# Patient Record
Sex: Male | Born: 1995 | Race: White | Hispanic: No | Marital: Single | Smoking: Never smoker
Health system: Southern US, Community
[De-identification: ages and names within clinical notes are randomized; demographics above are authoritative.]

## PROBLEM LIST (undated history)

## (undated) DIAGNOSIS — L709 Acne, unspecified: Secondary | ICD-10-CM

## (undated) HISTORY — PX: WISDOM TOOTH EXTRACTION: SHX21

---

## 2016-07-04 ENCOUNTER — Encounter: Payer: Self-pay | Admitting: Emergency Medicine

## 2016-07-04 ENCOUNTER — Emergency Department: Payer: Managed Care, Other (non HMO)

## 2016-07-04 ENCOUNTER — Emergency Department
Admission: EM | Admit: 2016-07-04 | Discharge: 2016-07-05 | Disposition: A | Discharge status: Home / Self Care | Payer: Managed Care, Other (non HMO) | Attending: Emergency Medicine | Admitting: Emergency Medicine

## 2016-07-04 DIAGNOSIS — W1809XA Striking against other object with subsequent fall, initial encounter: Secondary | ICD-10-CM | POA: Insufficient documentation

## 2016-07-04 DIAGNOSIS — Y998 Other external cause status: Secondary | ICD-10-CM | POA: Insufficient documentation

## 2016-07-04 DIAGNOSIS — S0990XA Unspecified injury of head, initial encounter: Secondary | ICD-10-CM | POA: Diagnosis present

## 2016-07-04 DIAGNOSIS — Y92219 Unspecified school as the place of occurrence of the external cause: Secondary | ICD-10-CM | POA: Diagnosis not present

## 2016-07-04 DIAGNOSIS — F0781 Postconcussional syndrome: Secondary | ICD-10-CM | POA: Insufficient documentation

## 2016-07-04 DIAGNOSIS — Y9339 Activity, other involving climbing, rappelling and jumping off: Secondary | ICD-10-CM | POA: Diagnosis not present

## 2016-07-04 DIAGNOSIS — S0181XA Laceration without foreign body of other part of head, initial encounter: Secondary | ICD-10-CM | POA: Diagnosis not present

## 2016-07-04 MED ORDER — ACETAMINOPHEN 500 MG PO TABS
ORAL_TABLET | ORAL | Status: AC
  Filled 2016-07-04: qty 1

## 2016-07-04 MED ORDER — ACETAMINOPHEN 500 MG PO TABS
1000.0000 mg | ORAL_TABLET | Freq: Once | ORAL | Status: AC
  Administered 2016-07-04: 1000 mg via ORAL
  Filled 2016-07-04: qty 2

## 2016-07-04 NOTE — ED Provider Notes (Signed)
[facility] Emergency Department Provider Note ____________________________________________  Time seen: 2232  I have reviewed the triage vital signs and the nursing notes.  HISTORY  Chief Complaint  Facial Laceration and Head Injury  HPI Spencer Cobb is a 20 y.o. male presents to the ED for evaluation of injuries sustained after a fall during theater practice. He recalls the trap door was open for him to jump through. He got his foot caught on the cable to the door. The door slams down hitting him on the back of the head, and caused his face to slam into the floor. He sustained a nasal contusion, nosebleed, and a laceration to the left nasolabial sulcus. He also notes dizziness, nausea without vomiting, and a dull headache.   History reviewed. No pertinent past medical history.  There are no active problems to display for this patient.  Past Surgical History:  Procedure Laterality Date  . WISDOM TOOTH EXTRACTION      Prior to Admission medications   Not on File   Allergies Review of patient's allergies indicates no known allergies.  No family history on file.  Social History Social History  Substance Use Topics  . Smoking status: Never Smoker  . Smokeless tobacco: Never Used  . Alcohol use Yes   Review of Systems  Constitutional: Negative for fever. Eyes: Negative for visual changes. ENT: Negative for sore throat. Reports nosebleed and facial lacertion.  Cardiovascular: Negative for chest pain. Respiratory: Negative for shortness of breath. Gastrointestinal: Negative for abdominal pain, vomiting and diarrhea. Musculoskeletal: Negative for back pain. Skin: Negative for rash. Neurological: Negative for focal weakness or numbness. Reports headache as above ____________________________________________  PHYSICAL EXAM:  VITAL SIGNS: ED Triage Vitals  Enc Vitals Group     BP 07/04/16 2030 116/72     Pulse Rate 07/04/16 2030 68     Resp  07/04/16 2030 18     Temp 07/04/16 2030 97.8 F (36.6 C)     Temp Source 07/04/16 2030 Oral     SpO2 07/04/16 2030 99 %     Weight 07/04/16 2031 130 lb (59 kg)     Height 07/04/16 2031 5\' 6"  (1.676 m)     Head Circumference --      Peak Flow --      Pain Score 07/04/16 2031 7     Pain Loc --      Pain Edu? --      Excl. in GC? --     Constitutional: Alert and oriented. Well appearing and in no distress. Head: Normocephalic and atraumatic, except for a superficial linear laceration that extends from the left nasal ala across the nasolabial sulcus about 3 cm. No active bleeding.  Eyes: Conjunctivae are normal. PERRL. Normal extraocular movements Ears: Canals clear. TMs intact bilaterally. Nose: No congestion/rhinorrhea/epistaxis. Dried blood in the bilateral vestibules. No active bleeding. No septal hematoma noted.  Mouth/Throat: Mucous membranes are moist. Neck: Supple. No thyromegaly. Cardiovascular: Normal rate, regular rhythm. Normal distal pulses. Respiratory: Normal respiratory effort. No wheezes/rales/rhonchi. Gastrointestinal: Soft and nontender. No distention. Musculoskeletal: Nontender with normal range of motion in all extremities.  Neurologic: CN II-XII grossly intact. Normal gait without ataxia. Normal speech and language. No gross focal neurologic deficits are appreciated. Skin:  Skin is warm, dry and intact. No rash noted. Psychiatric: Mood and affect are normal. Patient exhibits appropriate insight and judgment. ____________________________________________   RADIOLOGY  CT Head & Maxillofacial  IMPRESSION: No acute intracranial pathology.  No acute facial bone  fractures.  Paranasal sinus disease with partial opacification of the right maxillary sinus, possibly chronic. Clinical correlation is recommended. ____________________________________________  PROCEDURES  Tylenol 1000 mg PO   LACERATION REPAIR Performed by: Lissa Hoard Authorized  by: Lissa Hoard Consent: Verbal consent obtained. Risks and benefits: risks, benefits and alternatives were discussed Consent given by: patient Patient identity confirmed: provided demographic data Prepped and Draped in normal sterile fashion Wound explored  Laceration Location: left nasolabial sulcus  Laceration Length: 3 cm  No Foreign Bodies seen or palpated  Anesthesia: none   Irrigation method: gauze  Amount of cleaning: standard  Skin closure: cyanoacrylate wound adhesive  Patient tolerance: Patient tolerated the procedure well with no immediate complications. ____________________________________________  INITIAL IMPRESSION / ASSESSMENT AND PLAN / ED COURSE  Patient with a facial contusion and minor closed head injury with mild postconcussive syndrome without any radiologic evidence of acute intracranial process. There is no acute fracture dislocation of thin nasal bones or the facial bones. Patient's wound is repaired using wound adhesive at this time. He is discharged with instructions on management of his postconcussive symptoms. He will follow-up with this time a care provider, [facility], or return to the ED as needed for acutely worsening symptoms.  Clinical Course   ____________________________________________  FINAL CLINICAL IMPRESSION(S) / ED DIAGNOSES  Final diagnoses:  Injury of head, initial encounter  Facial laceration, initial encounter  Post concussion syndrome      Lissa Hoard, PA-C 07/05/16 0032    Minna Antis, MD 07/05/16 2344

## 2016-07-04 NOTE — ED Notes (Signed)
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  Bleeding is controlled at lac site, no open wound noted on back of head, although a slight bump is palpated.

## 2016-07-04 NOTE — ED Triage Notes (Signed)
Pt presents to ED after he was hit in the back of the head by a stage door which made his face hit the wood floor of the stage while at school. Denies loc. Feeling slightly dizzy with "a little nausea and a dull headache" currently. Pt has small laceration to his face that is thin with approximated edges. No bleeding noted at this time.

## 2016-07-05 NOTE — ED Notes (Addendum)
Pt. Verbalizes understanding of d/c instructions, and follow-up. VS stable and pain controlled per pt.  Pt. In NAD at time of d/c and denies further concerns regarding this visit. Pt. Stable at the time of departure from the unit, departing unit by the safest and most appropriate manner per that pt condition and limitations. Pt advised to return to the ED at any time for emergent concerns, or for new/worsening symptoms.   

## 2016-07-05 NOTE — Discharge Instructions (Signed)
Take Tylenol and Motrin as needed. [facility]. Return to the ED as needed for worsening symptoms.

## 2017-06-21 IMAGING — CT CT HEAD W/O CM
3 of 7 series · 15 of 47 positions shown, 18 images · non-contrast
Comparison: None.

CLINICAL DATA: 20-year-old male with postconcussive syndrome
following head injury.

EXAM:
CT HEAD WITHOUT CONTRAST
CT MAXILLOFACIAL WITHOUT CONTRAST
TECHNIQUE: Multidetector CT imaging of the head and maxillofacial structures
were performed using the standard protocol without intravenous
contrast. Multiplanar CT image reconstructions of the maxillofacial
structures were also generated.

[Series 6: max soft · axial · 0.33mm/px · z∈[+566,+686]mm · 10 of 73 slices shown, 13 images]
[im 7/73  brain]
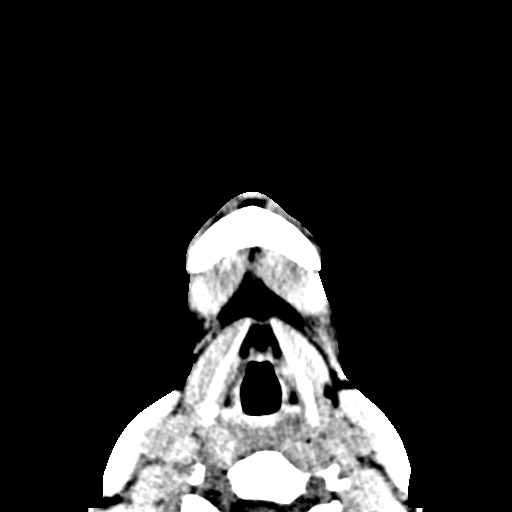
[im 7/73  bone]
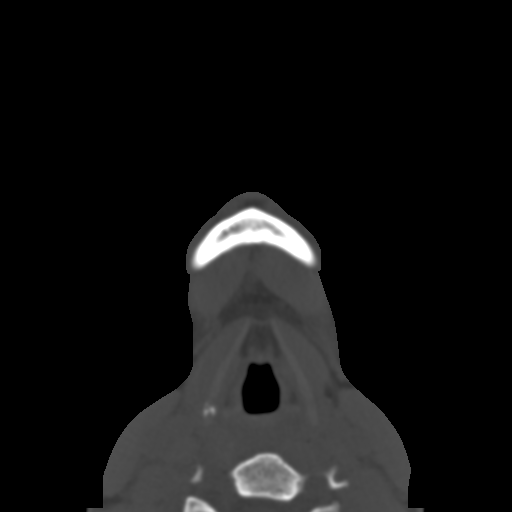
[im 13/73  brain]
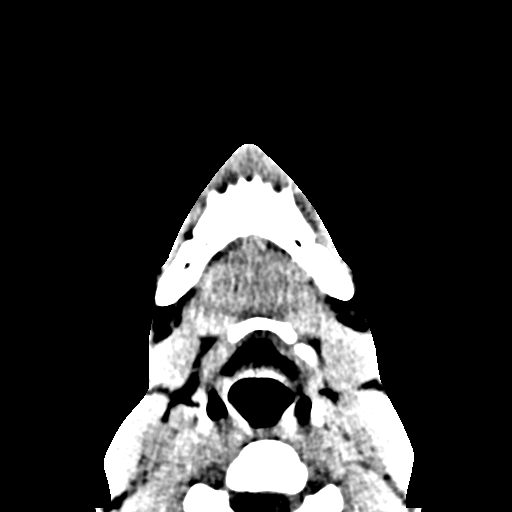
[im 19/73  brain]
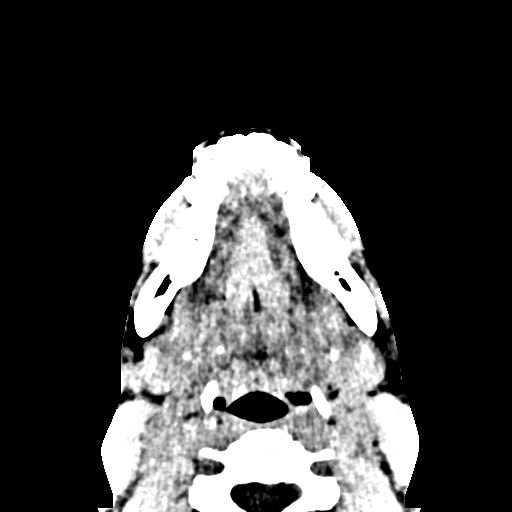
[im 25/73  brain]
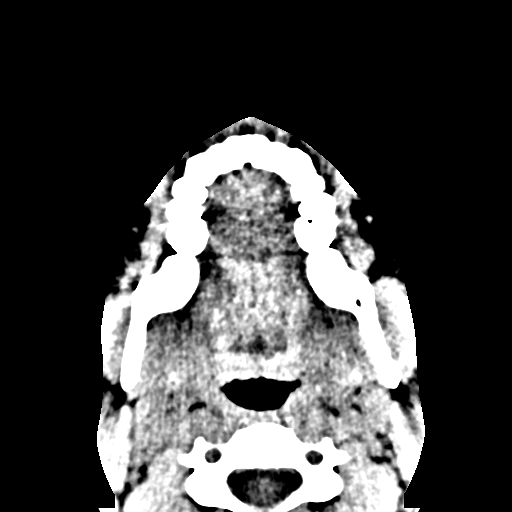
[im 31/73  brain]
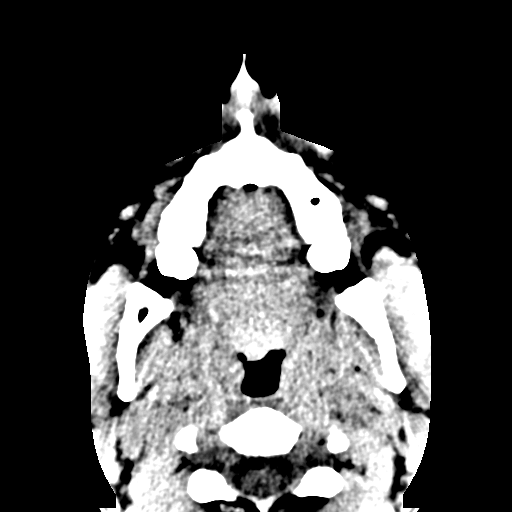
[im 31/73  bone]
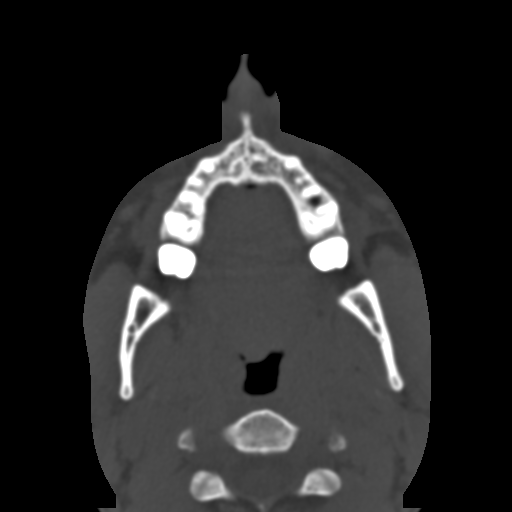
[im 43/73  brain]
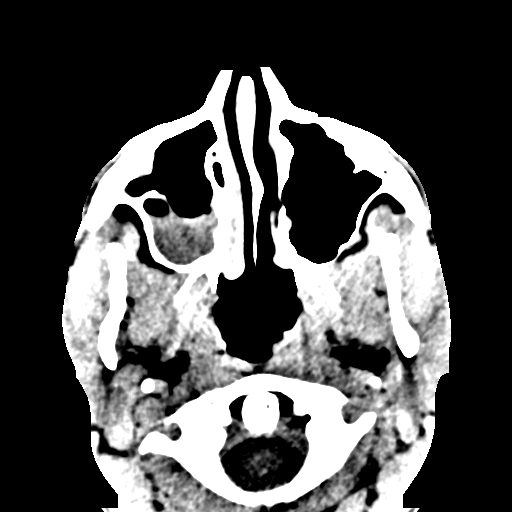
[im 49/73  brain]
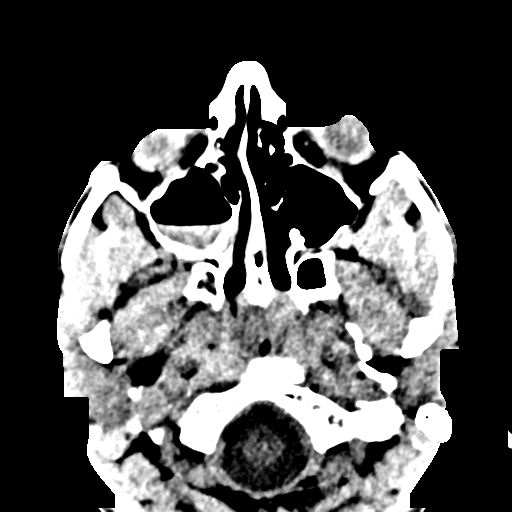
[im 55/73  brain]
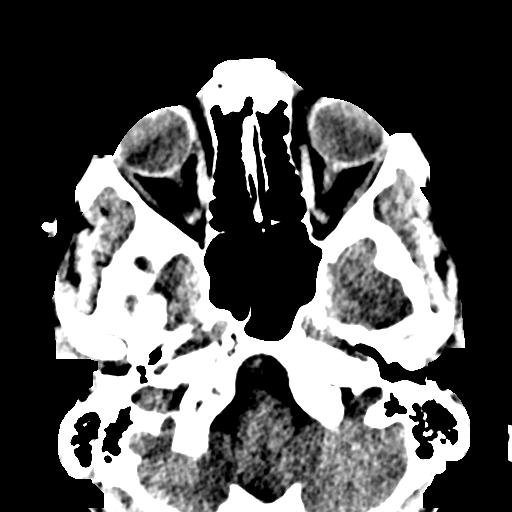
[im 61/73  brain]
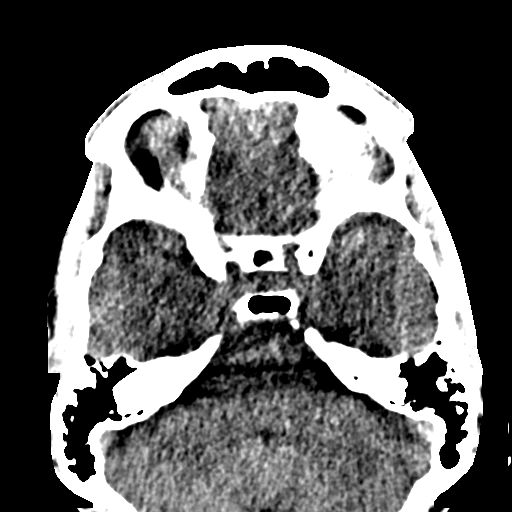
[im 61/73  bone]
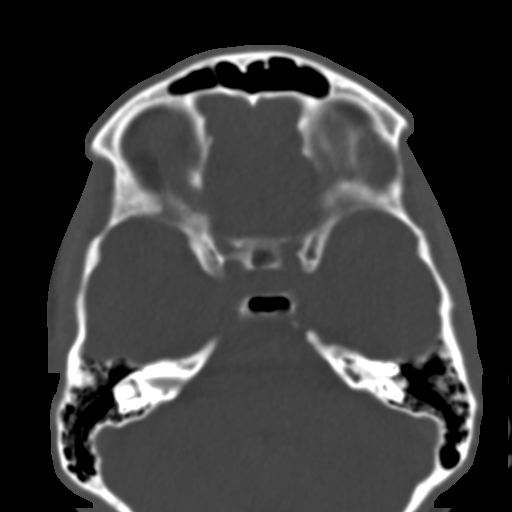
[im 67/73  brain]
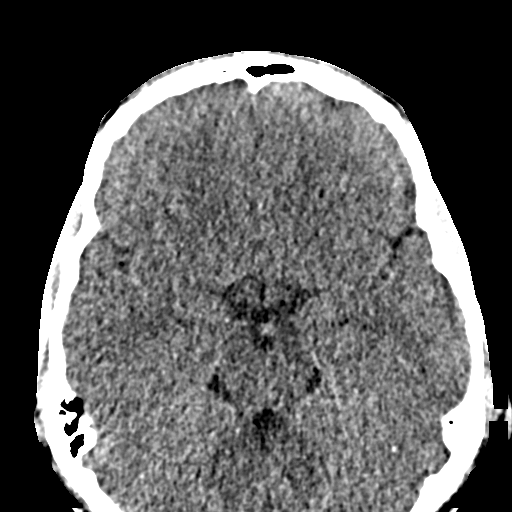

[Series 10: coronal soft · coronal · 0.33mm/px · 3 of 61 slices shown]
[im 3/61  brain]
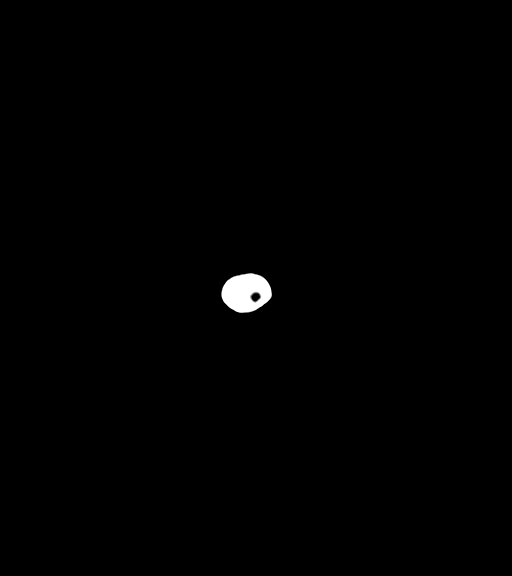
[im 17/61  brain]
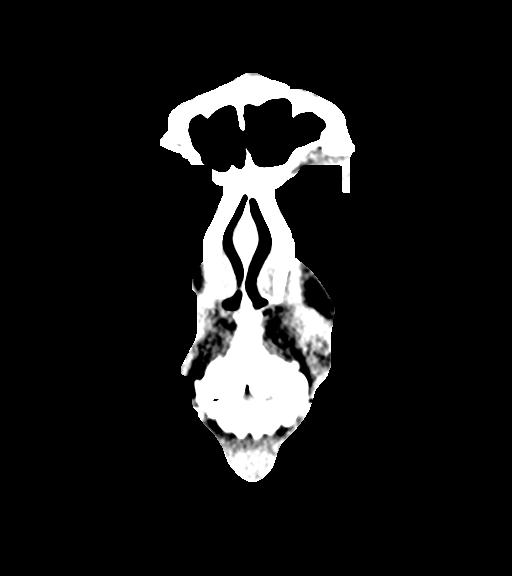
[im 32/61  brain]
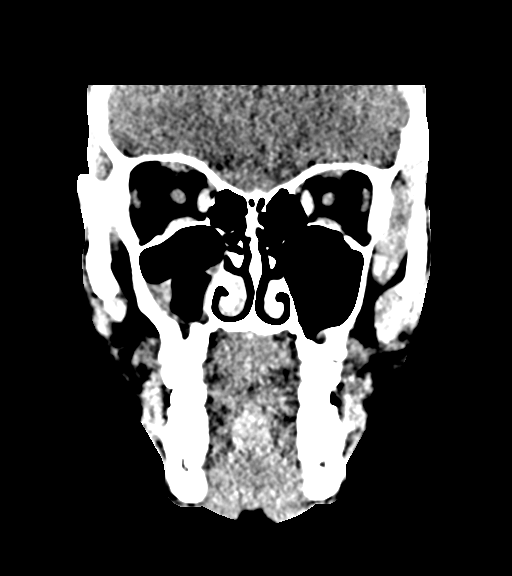

[Series 11: sagittal soft · sagittal · 0.27mm/px · 2 of 83 slices shown]
[im 28/83  brain]
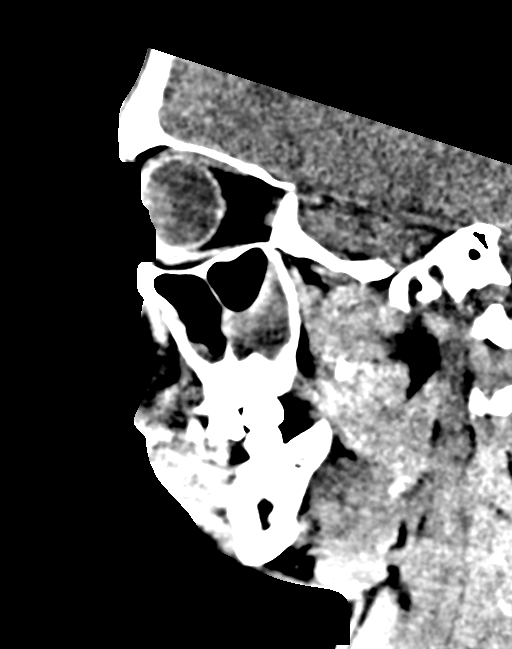
[im 55/83  brain]
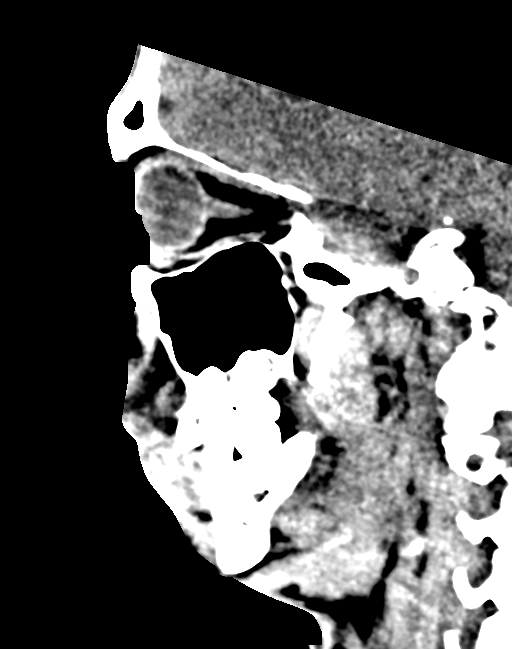

[15 of 47 positions shown; findings below may reference images not displayed]

FINDINGS: CT HEAD FINDINGS

Brain: No evidence of acute infarction, hemorrhage, hydrocephalus,
extra-axial collection or mass lesion/mass effect.

Vascular: No hyperdense vessel or unexpected calcification.

Skull: Normal. Negative for fracture or focal lesion.

Other: None

CT MAXILLOFACIAL FINDINGS

Osseous: No fracture or mandibular dislocation. No destructive
process.

Orbits: Negative. No traumatic or inflammatory finding.

Sinuses: There is mild diffuse mucoperiosteal thickening of
paranasal sinuses. There is partial opacification of the right
maxillary sinus. Although this may be chronic acute on chronic
sinusitis is not excluded. Clinical correlation is recommended.

Soft tissues: Negative.Bold
IMPRESSION: No acute intracranial pathology.

No acute facial bone fractures.

Paranasal sinus disease with partial opacification of the right
maxillary sinus, possibly chronic. Clinical correlation is
recommended.

## 2017-08-25 ENCOUNTER — Encounter: Payer: Self-pay | Admitting: Emergency Medicine

## 2017-08-25 ENCOUNTER — Other Ambulatory Visit: Payer: Self-pay

## 2017-08-25 ENCOUNTER — Emergency Department
Admission: EM | Admit: 2017-08-25 | Discharge: 2017-08-25 | Disposition: A | Discharge status: Home / Self Care | Payer: 59 | Source: Home / Self Care | Attending: Family Medicine | Admitting: Family Medicine

## 2017-08-25 DIAGNOSIS — J302 Other seasonal allergic rhinitis: Secondary | ICD-10-CM

## 2017-08-25 DIAGNOSIS — J069 Acute upper respiratory infection, unspecified: Secondary | ICD-10-CM | POA: Diagnosis not present

## 2017-08-25 HISTORY — DX: Acne, unspecified: L70.9

## 2017-08-25 MED ORDER — PREDNISONE 20 MG PO TABS: ORAL_TABLET | ORAL | 0 refills | Status: DC

## 2017-08-25 MED ORDER — AMOXICILLIN 875 MG PO TABS: 875.0000 mg | ORAL_TABLET | Freq: Two times a day (BID) | ORAL | 0 refills | Status: DC

## 2017-08-25 NOTE — ED Triage Notes (Signed)
Patient had cold symptoms recently which seemed to resolve; 3 days ago congestion returned with pressure over sinus areas and green nasal mucus. Has been taking sudafed and ibuprofen.

## 2017-08-25 NOTE — ED Provider Notes (Signed)
[facility]    CSN: [id number] Arrival date & time: 08/25/17  1400     History   Chief Complaint Chief Complaint  Patient presents with  . Nasal Congestion  . Facial Pain  . Headache    HPI Spencer Cobb is a 21 y.o. male.   Patient states that he developed a cold about 2.5 weeks ago that lasted about a week.  Three days ago he developed chills, increased sinus congestion, facial pressure, and post-nasal drainage.  He has a history of seasonal allergic rhinitis.   The history is provided by the patient.    Past Medical History:  Diagnosis Date  . Acne     There are no active problems to display for this patient.   Past Surgical History:  Procedure Laterality Date  . WISDOM TOOTH EXTRACTION         Home Medications    Prior to Admission medications   Medication Sig Start Date End Date Taking? Authorizing Provider  cetirizine (ZYRTEC) 10 MG tablet Take 10 mg by mouth daily.   Yes [provider]  clindamycin (CLEOCIN) 150 MG capsule Take by mouth 3 (three) times daily.   Yes [provider]  clindamycin-benzoyl peroxide (BENZACLIN) gel Apply topically every morning.   Yes [provider]  fluticasone (FLONASE) 50 MCG/ACT nasal spray Place into both nostrils daily.   Yes [provider]  Lactobacillus-Inulin (CULTURELLE HEALTH & WELLNESS PO) Take by mouth.   Yes [provider]  tazarotene (TAZORAC) 0.05 % cream Apply topically at bedtime.   Yes [provider]  amoxicillin (AMOXIL) 875 MG tablet Take 1 tablet (875 mg total) by mouth 2 (two) times daily. (Rx void after 09/02/17) 08/25/17   Lattie Haw, MD  predniSONE (DELTASONE) 20 MG tablet Take one tab by mouth twice daily for 4 days, then one daily. Take with food. 08/25/17   Lattie Haw, MD    Family History History reviewed. No pertinent family history.  Social History Social History   Tobacco Use  . Smoking status: Never Smoker    . Smokeless tobacco: Never Used  Substance Use Topics  . Alcohol use: Yes  . Drug use: No     Allergies   Patient has no known allergies.   Review of Systems Review of Systems No sore throat No cough No pleuritic pain No wheezing + nasal congestion + post-nasal drainage + sinus pain/pressure No itchy/red eyes No earache No hemoptysis No SOB No fever, + chills No nausea No vomiting No abdominal pain No diarrhea No urinary symptoms No skin rash + fatigue No myalgias No headache Used OTC meds without relief   Physical Exam Triage Vital Signs ED Triage Vitals  Enc Vitals Group     BP 08/25/17 1425 103/63     Pulse Rate 08/25/17 1425 73     Resp 08/25/17 1425 16     Temp 08/25/17 1425 (!) 97.4 F (36.3 C)     Temp Source 08/25/17 1425 Oral     SpO2 08/25/17 1425 100 %     Weight 08/25/17 1426 130 lb (59 kg)     Height 08/25/17 1426 5\' 6"  (1.676 m)     Head Circumference --      Peak Flow --      Pain Score --      Pain Loc --      Pain Edu? --      Excl. in GC? --    No  data found.  Updated Vital Signs BP 103/63 (BP Location: Right Arm)   Pulse 73   Temp (!) 97.4 F (36.3 C) (Oral)   Resp 16   Ht 5\' 6"  (1.676 m)   Wt 130 lb (59 kg)   SpO2 100%   BMI 20.98 kg/m   Visual Acuity Right Eye Distance:   Left Eye Distance:   Bilateral Distance:    Right Eye Near:   Left Eye Near:    Bilateral Near:     Physical Exam Nursing notes and Vital Signs reviewed. Appearance:  Patient appears stated age, and in no acute distress Eyes:  Pupils are equal, round, and reactive to light and accomodation.  Extraocular movement is intact.  Conjunctivae are not inflamed  Ears:  Canals normal.  Tympanic membranes normal.  Nose:  Congested turbinates.  No sinus tenderness.    Pharynx:  Normal Neck:  Supple.  Enlarged posterior/lateral nodes are palpated bilaterally, tender to palpation on the left.   Lungs:  Clear to auscultation.  Breath sounds are equal.   Moving air well. Heart:  Regular rate and rhythm without murmurs, rubs, or gallops.  Abdomen:  Nontender without masses or hepatosplenomegaly.  Bowel sounds are present.  No CVA or flank tenderness.  Extremities:  No edema.  Skin:  No rash present.    UC Treatments / Results  Labs (all labs ordered are listed, but only abnormal results are displayed) Labs Reviewed - No data to display  EKG  EKG Interpretation None       Radiology No results found.  Procedures Procedures (including critical care time)  Medications Ordered in UC Medications - No data to display   Initial Impression / Assessment and Plan / UC Course  I have reviewed the triage vital signs and the nursing notes.  Pertinent labs & imaging results that were available during my care of the patient were reviewed by me and considered in my medical decision making (see chart for details).    Suspect early viral URI.  There is no evidence of bacterial infection today.   Begin prednisone burst/taper. Continue Pseudoephedrine (30mg , one or two every 4 to 6 hours) for sinus congestion.  Get adequate rest.   May use Afrin nasal spray (or generic oxymetazoline) each morning for about 5 days and then discontinue.  Also recommend using saline nasal spray several times daily and saline nasal irrigation (AYR is a common brand).  Use Flonase nasal spray each morning after using Afrin nasal spray and saline nasal irrigation. Try warm salt water gargles for sore throat.  Stop all antihistamines for now, and other non-prescription cough/cold preparations. If cough develops, take plain guaifenesin (1200mg  extended release tabs such as Mucinex) twice daily, with plenty of water, for cough and congestion.  May take Delsym Cough Suppressant at bedtime for nighttime cough.  Begin Amoxicillin if not improving about one week or if persistent fever develops (Given a prescription to hold, with an expiration date)  Followup with Family  Doctor if not improved in about 10 days.    Final Clinical Impressions(s) / UC Diagnoses   Final diagnoses:  Viral URI  Seasonal allergic rhinitis, unspecified trigger    ED Discharge Orders        Ordered    predniSONE (DELTASONE) 20 MG tablet     08/25/17 1454    amoxicillin (AMOXIL) 875 MG tablet  2 times daily     08/25/17 1455  Lattie HawBeese, Chelsea Nusz A, MD 08/28/17 1328

## 2017-08-25 NOTE — Discharge Instructions (Signed)
Continue Pseudoephedrine (30mg , one or two every 4 to 6 hours) for sinus congestion.  Get adequate rest.   May use Afrin nasal spray (or generic oxymetazoline) each morning for about 5 days and then discontinue.  Also recommend using saline nasal spray several times daily and saline nasal irrigation (AYR is a common brand).  Use Flonase nasal spray each morning after using Afrin nasal spray and saline nasal irrigation. Try warm salt water gargles for sore throat.  Stop all antihistamines for now, and other non-prescription cough/cold preparations. If cough develops, take plain guaifenesin (1200mg  extended release tabs such as Mucinex) twice daily, with plenty of water, for cough and congestion.  May take Delsym Cough Suppressant at bedtime for nighttime cough.  Begin Amoxicillin if not improving about one week or if persistent fever develops

## 2017-10-26 ENCOUNTER — Encounter: Payer: Self-pay | Admitting: Emergency Medicine

## 2017-10-26 ENCOUNTER — Ambulatory Visit
Admission: EM | Admit: 2017-10-26 | Discharge: 2017-10-26 | Disposition: A | Discharge status: Home / Self Care | Payer: 59 | Attending: Family Medicine | Admitting: Family Medicine

## 2017-10-26 ENCOUNTER — Other Ambulatory Visit: Payer: Self-pay

## 2017-10-26 DIAGNOSIS — J029 Acute pharyngitis, unspecified: Secondary | ICD-10-CM

## 2017-10-26 LAB — CBC WITH DIFFERENTIAL/PLATELET
BASOS ABS: 0 10*3/uL (ref 0–0.1)
Basophils Relative: 0 %
EOS ABS: 0.1 10*3/uL (ref 0–0.7)
EOS PCT: 1 %
HCT: 41.8 % (ref 40.0–52.0)
Hemoglobin: 14.8 g/dL (ref 13.0–18.0)
Lymphocytes Relative: 23 %
Lymphs Abs: 2 10*3/uL (ref 1.0–3.6)
MCH: 33 pg (ref 26.0–34.0)
MCHC: 35.5 g/dL (ref 32.0–36.0)
MCV: 92.8 fL (ref 80.0–100.0)
Monocytes Absolute: 0.7 10*3/uL (ref 0.2–1.0)
Monocytes Relative: 8 %
Neutro Abs: 5.8 10*3/uL (ref 1.4–6.5)
Neutrophils Relative %: 68 %
PLATELETS: 276 10*3/uL (ref 150–440)
RBC: 4.5 MIL/uL (ref 4.40–5.90)
RDW: 12.3 % (ref 11.5–14.5)
WBC: 8.6 10*3/uL (ref 3.8–10.6)

## 2017-10-26 LAB — MONONUCLEOSIS SCREEN: MONO SCREEN: NEGATIVE

## 2017-10-26 LAB — RAPID STREP SCREEN (MED CTR MEBANE ONLY): Streptococcus, Group A Screen (Direct): NEGATIVE

## 2017-10-26 MED ORDER — LIDOCAINE VISCOUS 2 % MT SOLN: 15.0000 mL | Freq: Three times a day (TID) | OROMUCOSAL | 0 refills | Status: DC | PRN

## 2017-10-26 NOTE — ED Triage Notes (Signed)
Patient in today c/o sore throat x 3-4 days. [facility] [employer] and was not tested for strep, but given a Zpak. Patient states he is not better.

## 2017-10-26 NOTE — ED Provider Notes (Signed)
[facility] ____________________________________________  Time seen: Approximately 1210 PM  I have reviewed the triage vital signs and the nursing notes.   HISTORY  Chief Complaint Sore Throat (APPT)   HPI Spencer Cobb is a 22 y.o. male presenting for evaluation of sore throat that is been present since Monday.  Patient reports sore throat is described as moderate and painful to swallow.  Reports has continued to overall eat and drink well.  States was seen by [place] states that he has taken 3 days worth of Z-Pak, but expresses concern as he still has a sore throat.  Swallowing fine.  Reports campus health and was prescribed a Z-Pak.  He does have a friend that has recently had mono.  Has taken over-the-counter ibuprofen which does help with sore throat as well as some gargles.  Denies fevers, cough, congestion or other complaints.  No rash.  Denies recent sickness.  Denies history of mono.  States had strep as a young child.  Not active in contact sports currently.  Denies chest pain, shortness of breath, abdominal pain, dysuria, extremity pain, extremity swelling or rash. Denies recent sickness. Denies recent antibiotic use.    Past Medical History:  Diagnosis Date  . Acne     There are no active problems to display for this patient.   Past Surgical History:  Procedure Laterality Date  . WISDOM TOOTH EXTRACTION       No current facility-administered medications for this encounter.   Current Outpatient Medications:  .  Azithromycin (ZITHROMAX Z-PAK PO), Take by mouth daily., Disp: , Rfl:  .  cefadroxil (DURICEF) 500 MG capsule, Take 500 mg by mouth 2 (two) times daily., Disp: , Rfl:  .  cetirizine (ZYRTEC) 10 MG tablet, Take 10 mg by mouth daily., Disp: , Rfl:  .  clindamycin (CLEOCIN) 150 MG capsule, Take by mouth 3 (three) times daily., Disp: , Rfl:  .  clindamycin-benzoyl peroxide (BENZACLIN) gel, Apply topically every morning., Disp: , Rfl:  .  fluticasone  (FLONASE) 50 MCG/ACT nasal spray, Place into both nostrils daily., Disp: , Rfl:  .  Lactobacillus-Inulin (CULTURELLE HEALTH & WELLNESS PO), Take by mouth., Disp: , Rfl:  .  tazarotene (TAZORAC) 0.05 % cream, Apply topically at bedtime., Disp: , Rfl:  .  lidocaine (XYLOCAINE) 2 % solution, Use as directed 15 mLs in the mouth or throat every 8 (eight) hours as needed (sore throat. gargle and spit as needed for sore throat.)., Disp: 100 mL, Rfl: 0  Allergies Patient has no known allergies.  Family History  Problem Relation Age of Onset  . Thyroid disease Mother   . Hyperlipidemia Father     Social History Social History   Tobacco Use  . Smoking status: Never Smoker  . Smokeless tobacco: Never Used  Substance Use Topics  . Alcohol use: Yes    Comment: occassionally  . Drug use: No    Review of Systems Constitutional: No fever/chills ENT: Positive sore throat. Cardiovascular: Denies chest pain. Respiratory: Denies shortness of breath. Gastrointestinal: No abdominal pain.  No nausea, no vomiting.  No diarrhea.   Genitourinary: Negative for dysuria. Musculoskeletal: Negative for back pain. Skin: Negative for rash.  ____________________________________________   PHYSICAL EXAM:  VITAL SIGNS: ED Triage Vitals  Enc Vitals Group     BP 10/26/17 1126 97/60     Pulse Rate 10/26/17 1126 73     Resp 10/26/17 1126 16     Temp 10/26/17 1126 97.7 F (36.5 C)  Temp Source 10/26/17 1126 Oral     SpO2 10/26/17 1126 100 %     Weight 10/26/17 1127 130 lb (59 kg)     Height 10/26/17 1127 5\' 6"  (1.676 m)     Head Circumference --      Peak Flow --      Pain Score 10/26/17 1127 8     Pain Loc --      Pain Edu? --      Excl. in GC? --    Constitutional: Alert and oriented. Well appearing and in no acute distress. Eyes: Conjunctivae are normal.  Head: Atraumatic. No sinus tenderness to palpation. No swelling. No erythema.  Ears: no erythema, normal TMs bilaterally.   Nose:No  nasal congestion   Mouth/Throat: Mucous membranes are moist. Mild pharyngeal erythema.  Mild bilateral tonsillar swelling.  No exudate. Neck: No stridor.  No cervical spine tenderness to palpation. Hematological/Lymphatic/Immunilogical: Mild bilateral anterior cervical lymphadenopathy. Cardiovascular: Normal rate, regular rhythm. Grossly normal heart sounds.  Good peripheral circulation. Respiratory: Normal respiratory effort.  No retractions. No wheezes, rales or rhonchi. Good air movement.  Gastrointestinal: Soft and nontender. No CVA tenderness.  No hepatosplenomegaly palpated. Musculoskeletal: Ambulatory with steady gait. No cervical, thoracic or lumbar tenderness to palpation. Neurologic:  Normal speech and language. No gait instability. Skin:  Skin appears warm, dry and intact. No rash noted. Psychiatric: Mood and affect are normal. Speech and behavior are normal.  ___________________________________________   LABS (all labs ordered are listed, but only abnormal results are displayed)  Labs Reviewed  RAPID STREP SCREEN (NOT AT [facility])  CULTURE, GROUP A STREP ([facility])  MONONUCLEOSIS SCREEN  CBC WITH DIFFERENTIAL/PLATELET   ________________________________________   PROCEDURES Procedures    INITIAL IMPRESSION / ASSESSMENT AND PLAN / ED COURSE  Pertinent labs & imaging results that were available during my care of the patient were reviewed by me and considered in my medical decision making (see chart for details).  Well-appearing patient.  No acute distress.  Pharyngitis present this week.  Started on azithromycin but continues with sore throat, prompting patient for reevaluation.  Quick strep negative, will culture.  Discussed with patient evaluation of mono, CBC and mono reviewed, negative and unremarkable.  Will await strep culture, encouraged to continue antibiotic to completion.  PRN viscous lidocaine gargles.  Continue over-the-counter ibuprofen as needed.  Encourage rest,  fluids, supportive care.Discussed indication, risks and benefits of medications with patient.   Discussed follow up with Primary care physician this week. Discussed follow up and return parameters including no resolution or any worsening concerns. Patient verbalized understanding and agreed to plan.   ____________________________________________   FINAL CLINICAL IMPRESSION(S) / ED DIAGNOSES  Final diagnoses:  Pharyngitis, unspecified etiology     ED Discharge Orders        Ordered    lidocaine (XYLOCAINE) 2 % solution  Every 8 hours PRN     10/26/17 1301       Note: This dictation was prepared with Dragon dictation along with smaller phrase technology. Any transcriptional errors that result from this process are unintentional.         Renford DillsMiller, Cherolyn Behrle, NP 10/26/17 1342

## 2017-10-26 NOTE — Discharge Instructions (Signed)
Take medication as prescribed. Rest. Drink plenty of fluids.  ° °Follow up with your primary care physician this week as needed. Return to Urgent care for new or worsening concerns.  ° °

## 2017-10-29 LAB — CULTURE, GROUP A STREP (THRC)

## 2018-06-25 ENCOUNTER — Other Ambulatory Visit: Payer: Self-pay

## 2018-06-25 ENCOUNTER — Emergency Department
Admission: EM | Admit: 2018-06-25 | Discharge: 2018-06-25 | Disposition: A | Discharge status: Home / Self Care | Payer: 59 | Attending: Emergency Medicine | Admitting: Emergency Medicine

## 2018-06-25 DIAGNOSIS — Z79899 Other long term (current) drug therapy: Secondary | ICD-10-CM | POA: Diagnosis not present

## 2018-06-25 DIAGNOSIS — R197 Diarrhea, unspecified: Secondary | ICD-10-CM

## 2018-06-25 LAB — COMPREHENSIVE METABOLIC PANEL
ALT: 18 U/L (ref 0–44)
AST: 47 U/L — AB (ref 15–41)
Albumin: 4.3 g/dL (ref 3.5–5.0)
Alkaline Phosphatase: 62 U/L (ref 38–126)
Anion gap: 10 (ref 5–15)
BUN: 19 mg/dL (ref 6–20)
CHLORIDE: 101 mmol/L (ref 98–111)
CO2: 26 mmol/L (ref 22–32)
CREATININE: 1 mg/dL (ref 0.61–1.24)
Calcium: 9.4 mg/dL (ref 8.9–10.3)
GFR calc Af Amer: >60 mL/min (ref >60)
Glucose, Bld: 100 mg/dL — ABNORMAL HIGH (ref 70–99)
Potassium: 4.3 mmol/L (ref 3.5–5.1)
Sodium: 137 mmol/L (ref 135–145)
Total Bilirubin: 1.2 mg/dL (ref 0.3–1.2)
Total Protein: 7.2 g/dL (ref 6.5–8.1)

## 2018-06-25 LAB — CBC
HEMATOCRIT: 45.2 % (ref 39.0–52.0)
Hemoglobin: 16 g/dL (ref 13.0–17.0)
MCH: 32.7 pg (ref 26.0–34.0)
MCHC: 35.4 g/dL (ref 30.0–36.0)
MCV: 92.2 fL (ref 80.0–100.0)
Platelets: 236 10*3/uL (ref 150–400)
RBC: 4.9 MIL/uL (ref 4.22–5.81)
RDW: 11.7 % (ref 11.5–15.5)
WBC: 9.5 10*3/uL (ref 4.0–10.5)
nRBC: 0 % (ref 0.0–0.2)

## 2018-06-25 LAB — LIPASE, BLOOD: LIPASE: 22 U/L (ref 11–51)

## 2018-06-25 MED ORDER — ONDANSETRON HCL 4 MG/2ML IJ SOLN
4.0000 mg | Freq: Once | INTRAMUSCULAR | Status: AC
  Administered 2018-06-25: 4 mg via INTRAVENOUS
  Filled 2018-06-25: qty 2

## 2018-06-25 MED ORDER — SODIUM CHLORIDE 0.9 % IV BOLUS
1000.0000 mL | Freq: Once | INTRAVENOUS | Status: AC
Start: 2018-06-25 — End: 2018-06-25
  Administered 2018-06-25: 1000 mL via INTRAVENOUS

## 2018-06-25 MED ORDER — ONDANSETRON HCL 4 MG PO TABS: 4.0000 mg | ORAL_TABLET | Freq: Three times a day (TID) | ORAL | 0 refills | Status: DC | PRN

## 2018-06-25 NOTE — ED Notes (Signed)
Pt c/o having abd cramping with watery diarrhea since last night, states he has had about 8 stools today. Denies vomiting. States he has been trying to drink extra fluids to keep hydrated. Pt is in NAD on arrival.

## 2018-06-25 NOTE — ED Notes (Signed)
Pt drinking PO fluids, will reassess pt tolerance in .

## 2018-06-25 NOTE — ED Triage Notes (Signed)
Pt to the ER for diarrhea since last night. Pt states he has nausea but no vomiting. Pt is attempting to hydrate at home. Last episode of diarrhea was 1 hour ago. No other symptoms.

## 2018-06-25 NOTE — ED Provider Notes (Addendum)
[facility] Emergency Department Provider Note  ____________________________________________   I have reviewed the triage vital signs and the nursing notes. Where available I have reviewed prior notes and, if possible and indicated, outside hospital notes.    HISTORY  Chief Complaint Diarrhea    HPI Spencer Cobb is a 22 y.o. male   Who presents today complaining of diarrhea.  Has had about 8 episodes of diarrhea since this morning.  Nonbloody.  No melena or bright red blood per rectum.  No vomiting no fever no chest pain or shortness of breath no recent travel no recent antibiotics positive sick contacts denies HIV or other medical problems.  States that he has been able to drink but he does feel nauseated.  It is really started about 15 hours ago.  He would like an IV     Past Medical History:  Diagnosis Date  . Acne     There are no active problems to display for this patient.   Past Surgical History:  Procedure Laterality Date  . WISDOM TOOTH EXTRACTION      Prior to Admission medications   Medication Sig Start Date End Date Taking? Authorizing Provider  Azithromycin (ZITHROMAX Z-PAK PO) Take by mouth daily.    [provider]  cefadroxil (DURICEF) 500 MG capsule Take 500 mg by mouth 2 (two) times daily.    [provider]  cetirizine (ZYRTEC) 10 MG tablet Take 10 mg by mouth daily.    [provider]  clindamycin (CLEOCIN) 150 MG capsule Take by mouth 3 (three) times daily.    [provider]  clindamycin-benzoyl peroxide (BENZACLIN) gel Apply topically every morning.    [provider]  fluticasone (FLONASE) 50 MCG/ACT nasal spray Place into both nostrils daily.    [provider]  Lactobacillus-Inulin (CULTURELLE HEALTH & WELLNESS PO) Take by mouth.    [provider]  lidocaine (XYLOCAINE) 2 % solution Use as directed 15 mLs in the mouth or throat every 8 (eight) hours as needed  (sore throat. gargle and spit as needed for sore throat.). 10/26/17   Renford Dills, NP  tazarotene (TAZORAC) 0.05 % cream Apply topically at bedtime.    [provider]    Allergies Patient has no known allergies.  Family History  Problem Relation Age of Onset  . Thyroid disease Mother   . Hyperlipidemia Father     Social History Social History   Tobacco Use  . Smoking status: Never Smoker  . Smokeless tobacco: Never Used  Substance Use Topics  . Alcohol use: Yes    Comment: occassionally  . Drug use: No    Review of Systems Constitutional: No fever/chills Eyes: No visual changes. ENT: No sore throat. No stiff neck no neck pain Cardiovascular: Denies chest pain. Respiratory: Denies shortness of breath. Gastrointestinal:   no vomiting.  + diarrhea.  No constipation. Genitourinary: Negative for dysuria. Musculoskeletal: Negative lower extremity swelling Skin: Negative for rash. Neurological: Negative for severe headaches, focal weakness or numbness.   ____________________________________________   PHYSICAL EXAM:  VITAL SIGNS: ED Triage Vitals  Enc Vitals Group     BP 06/25/18 1639 (!) 116/42     Pulse Rate 06/25/18 1639 89     Resp 06/25/18 1639 18     Temp 06/25/18 1639 98.1 F (36.7 C)     Temp Source 06/25/18 1639 Oral     SpO2 06/25/18 1639 98 %     Weight 06/25/18 1640 130 lb (59 kg)  Height 06/25/18 1640 5\' 6"  (1.676 m)     Head Circumference --      Peak Flow --      Pain Score 06/25/18 1640 7     Pain Loc --      Pain Edu? --      Excl. in GC? --     Constitutional: Alert and oriented. Well appearing and in no acute distress. Eyes: Conjunctivae are normal Head: Atraumatic HEENT: No congestion/rhinnorhea. Mucous membranes are moist.  Oropharynx non-erythematous Neck:   Nontender with no meningismus, no masses, no stridor Cardiovascular: Normal rate, regular rhythm. Grossly normal heart sounds.  Good peripheral  circulation. Respiratory: Normal respiratory effort.  No retractions. Lungs CTAB. Abdominal: Soft and nontender. No distention. No guarding no rebound Back:  There is no focal tenderness or step off.  there is no midline tenderness there are no lesions noted. there is no CVA tenderness Musculoskeletal: No lower extremity tenderness, no upper extremity tenderness. No joint effusions, no DVT signs strong distal pulses no edema Neurologic:  Normal speech and language. No gross focal neurologic deficits are appreciated.  Skin:  Skin is warm, dry and intact. No rash noted. Psychiatric: Mood and affect are normal. Speech and behavior are normal.  ____________________________________________   LABS (all labs ordered are listed, but only abnormal results are displayed)  Labs Reviewed  LIPASE, BLOOD  COMPREHENSIVE METABOLIC PANEL  CBC  URINALYSIS, COMPLETE (UACMP) WITH MICROSCOPIC    Pertinent labs  results that were available during my care of the patient were reviewed by me and considered in my medical decision making (see chart for details). ____________________________________________  EKG  I personally interpreted any EKGs ordered by me or triage  ____________________________________________  RADIOLOGY  Pertinent labs & imaging results that were available during my care of the patient were reviewed by me and considered in my medical decision making (see chart for details). If possible, patient and/or family made aware of any abnormal findings.  No results found. ____________________________________________    PROCEDURES  Procedure(s) performed: None  Procedures  Critical Care performed: None  ____________________________________________   INITIAL IMPRESSION / ASSESSMENT AND PLAN / ED COURSE  Pertinent labs & imaging results that were available during my care of the patient were reviewed by me and considered in my medical decision making (see chart for details).  Very  well-appearing patient is been having diarrhea for the last several hours.  We will give him IV fluid antinausea medication.  Does not think he give [place] a stool sample at this time for culture.  No recent antibiotics nothing to suggest C. difficile.  Most likely this is a viral illness or food poisoning.   ----------------------------------------- 7:02 PM on 06/25/2018 -----------------------------------------  Patient in no acute distress, tolerating p.o., eager to go abd benign at d/c.   ____________________________________________   FINAL CLINICAL IMPRESSION(S) / ED DIAGNOSES  Final diagnoses:  None      This chart was dictated using voice recognition software.  Despite best efforts to proofread,  errors can occur which can change meaning.      Jeanmarie Plant, MD 06/25/18 1702    Jeanmarie Plant, MD 06/25/18 [phone]

## 2018-06-25 NOTE — Discharge Instructions (Signed)
If you start passing significant blood, have any fevers, significant abdominal pain, persistent vomiting or you feel worse in any way return to the emergency department.

## 2018-11-06 ENCOUNTER — Other Ambulatory Visit: Payer: Self-pay

## 2018-11-06 ENCOUNTER — Ambulatory Visit
Admission: EM | Admit: 2018-11-06 | Discharge: 2018-11-06 | Disposition: A | Discharge status: Home / Self Care | Payer: 59 | Attending: Family Medicine | Admitting: Family Medicine

## 2018-11-06 DIAGNOSIS — J029 Acute pharyngitis, unspecified: Secondary | ICD-10-CM

## 2018-11-06 LAB — RAPID STREP SCREEN (MED CTR MEBANE ONLY): Streptococcus, Group A Screen (Direct): NEGATIVE

## 2018-11-06 MED ORDER — PENICILLIN G BENZATHINE 1200000 UNIT/2ML IM SUSP
1.2000 10*6.[IU] | Freq: Once | INTRAMUSCULAR | Status: AC
  Administered 2018-11-06: 1.2 10*6.[IU] via INTRAMUSCULAR

## 2018-11-06 MED ORDER — CLINDAMYCIN HCL 300 MG PO CAPS: 300.0000 mg | ORAL_CAPSULE | Freq: Three times a day (TID) | ORAL | 0 refills | Status: AC

## 2018-11-06 NOTE — Discharge Instructions (Signed)
Antibiotic as prescribed.  Take care  Dr. Artist Bloom  

## 2018-11-06 NOTE — ED Provider Notes (Signed)
[facility]    CSN: [id number] Arrival date & time: 11/06/18  1131  History   Chief Complaint Chief Complaint  Patient presents with  . Sore Throat   HPI 23 year old male presents with severe sore throat.  Patient reports that 1 month ago he had tonsillitis and was treated with clindamycin.  He subsequently developed sinusitis and finished antibiotics approximately 1 week ago.  Developed severe sore throat starting Monday.  This has steadily progressed.  Patient notes exudate at the back of his throat/tonsils.  Has difficulty swallowing.  Sore throat is severe.  No known relieving factors.  No documented fever.  No other associated symptoms.  No other complaints.  PMH, Surgical Hx, Family Hx, Social History reviewed and updated as below.  Past Medical History:  Diagnosis Date  . Acne    Past Surgical History:  Procedure Laterality Date  . WISDOM TOOTH EXTRACTION     Home Medications    Prior to Admission medications   Medication Sig Start Date End Date Taking? Authorizing Provider  fluticasone (FLONASE) 50 MCG/ACT nasal spray Place into both nostrils daily.   Yes [provider]  Lactobacillus-Inulin (CULTURELLE HEALTH & WELLNESS PO) Take by mouth.   Yes [provider]  tazarotene (TAZORAC) 0.05 % cream Apply topically at bedtime.   Yes [provider]  clindamycin (CLEOCIN) 300 MG capsule Take 1 capsule (300 mg total) by mouth 3 (three) times daily for 10 days. 11/06/18 11/16/18  Tommie Sams, DO   Family History Family History  Problem Relation Age of Onset  . Thyroid disease Mother   . Hyperlipidemia Father    Social History Social History   Tobacco Use  . Smoking status: Never Smoker  . Smokeless tobacco: Never Used  Substance Use Topics  . Alcohol use: Yes    Comment: occassionally  . Drug use: No   Allergies   Patient has no known allergies.  Review of Systems Review of Systems  Constitutional: Negative for fever.    HENT: Positive for sore throat.    Physical Exam Triage Vital Signs ED Triage Vitals  Enc Vitals Group     BP 11/06/18 1151 108/72     Pulse Rate 11/06/18 1151 72     Resp 11/06/18 1151 18     Temp 11/06/18 1151 98.2 F (36.8 C)     Temp Source 11/06/18 1151 Oral     SpO2 11/06/18 1151 100 %     Weight 11/06/18 1149 130 lb (59 kg)     Height 11/06/18 1149 5\' 6"  (1.676 m)     Head Circumference --      Peak Flow --      Pain Score 11/06/18 1149 8     Pain Loc --      Pain Edu? --      Excl. in GC? --    Updated Vital Signs BP 108/72 (BP Location: Left Arm)   Pulse 72   Temp 98.2 F (36.8 C) (Oral)   Resp 18   Ht 5\' 6"  (1.676 m)   Wt 59 kg   SpO2 100%   BMI 20.98 kg/m   Visual Acuity Right Eye Distance:   Left Eye Distance:   Bilateral Distance:    Right Eye Near:   Left Eye Near:    Bilateral Near:     Physical Exam Vitals signs and nursing note reviewed.  Constitutional:      General: He is not in acute distress.  Appearance: Normal appearance.  HENT:     Head: Normocephalic and atraumatic.     Right Ear: Tympanic membrane normal.     Left Ear: Tympanic membrane normal.     Nose: Nose normal.     Mouth/Throat:     Comments: Pharynx with 2-3+ tonsils.  Severe exudate noted on the left tonsil.  Exudate also noted on the right tonsil.  Severe oropharyngeal erythema. Eyes:     General:        Right eye: No discharge.        Left eye: No discharge.     Conjunctiva/sclera: Conjunctivae normal.  Cardiovascular:     Rate and Rhythm: Normal rate and regular rhythm.  Pulmonary:     Effort: Pulmonary effort is normal.     Breath sounds: Normal breath sounds.  Neurological:     Mental Status: He is alert.  Psychiatric:        Mood and Affect: Mood normal.        Behavior: Behavior normal.    UC Treatments / Results  Labs (all labs ordered are listed, but only abnormal results are displayed) Labs Reviewed  RAPID STREP SCREEN ([facility] ONLY)   CULTURE, GROUP A STREP [facility])    EKG None  Radiology No results found.  Procedures Procedures (including critical care time)  Medications Ordered in UC Medications  penicillin g benzathine (BICILLIN LA) 1200000 UNIT/2ML injection 1.2 Million Units (1.2 Million Units Intramuscular Given 11/06/18 1244)    Initial Impression / Assessment and Plan / UC Course  I have reviewed the triage vital signs and the nursing notes.  Pertinent labs & imaging results that were available during my care of the patient were reviewed by me and considered in my medical decision making (see chart for details).    23 year old male presents with pharyngitis/tonsillitis.  Strep negative.  Given physical exam findings, I am treating him with Bicillin.  Also placing him on clindamycin to cover for other potential etiologies given negative rapid strep test.  Final Clinical Impressions(s) / UC Diagnoses   Final diagnoses:  Pharyngitis, unspecified etiology     Discharge Instructions     Antibiotic as prescribed.  Take care  Dr. Adriana Simas    ED Prescriptions    Medication Sig Dispense Auth. Provider   clindamycin (CLEOCIN) 300 MG capsule Take 1 capsule (300 mg total) by mouth 3 (three) times daily for 10 days. 30 capsule Tommie Sams, DO     Controlled Substance Prescriptions [place] Controlled Substance Registry consulted? Not Applicable   Tommie Sams, DO 11/06/18 1321

## 2018-11-06 NOTE — ED Triage Notes (Signed)
Patient complains of sore throat that started on Monday night. Patient states that around 1 month ago and  was treated for tonsillitis with Clindamycin. Patient states that he has also finished a round on Augmentin around 1 week ago. Patient states that symptoms returned on Monday this week.

## 2018-11-09 LAB — CULTURE, GROUP A STREP (THRC)
